# Patient Record
Sex: Female | Born: 1955 | Race: White | Hispanic: No | Marital: Married | Smoking: Never smoker
Health system: Southern US, Community
[De-identification: ages and names within clinical notes are randomized; demographics above are authoritative.]

## PROBLEM LIST (undated history)

## (undated) DIAGNOSIS — I10 Essential (primary) hypertension: Secondary | ICD-10-CM

---

## 1998-01-05 ENCOUNTER — Ambulatory Visit (HOSPITAL_COMMUNITY): Admission: RE | Admit: 1998-01-05 | Discharge: 1998-01-05 | Payer: Self-pay | Admitting: Family Medicine

## 1999-01-17 ENCOUNTER — Ambulatory Visit (HOSPITAL_COMMUNITY): Admission: RE | Admit: 1999-01-17 | Discharge: 1999-01-17 | Payer: Self-pay | Admitting: Family Medicine

## 2000-02-05 ENCOUNTER — Ambulatory Visit (HOSPITAL_COMMUNITY): Admission: RE | Admit: 2000-02-05 | Discharge: 2000-02-05 | Payer: Self-pay | Admitting: Family Medicine

## 2000-02-05 ENCOUNTER — Encounter: Payer: Self-pay | Admitting: Family Medicine

## 2001-02-11 ENCOUNTER — Ambulatory Visit (HOSPITAL_COMMUNITY): Admission: RE | Admit: 2001-02-11 | Discharge: 2001-02-11 | Payer: Self-pay | Admitting: Family Medicine

## 2001-02-11 ENCOUNTER — Encounter: Payer: Self-pay | Admitting: Family Medicine

## 2002-02-15 ENCOUNTER — Encounter: Payer: Self-pay | Admitting: Family Medicine

## 2002-02-15 ENCOUNTER — Ambulatory Visit (HOSPITAL_COMMUNITY): Admission: RE | Admit: 2002-02-15 | Discharge: 2002-02-15 | Payer: Self-pay | Admitting: Family Medicine

## 2003-01-13 ENCOUNTER — Other Ambulatory Visit: Admission: RE | Admit: 2003-01-13 | Discharge: 2003-01-13 | Payer: Self-pay | Admitting: Family Medicine

## 2003-02-24 ENCOUNTER — Encounter: Payer: Self-pay | Admitting: Family Medicine

## 2003-02-24 ENCOUNTER — Ambulatory Visit (HOSPITAL_COMMUNITY): Admission: RE | Admit: 2003-02-24 | Discharge: 2003-02-24 | Payer: Self-pay | Admitting: Family Medicine

## 2004-01-11 ENCOUNTER — Other Ambulatory Visit: Admission: RE | Admit: 2004-01-11 | Discharge: 2004-01-11 | Payer: Self-pay | Admitting: Family Medicine

## 2004-02-26 ENCOUNTER — Ambulatory Visit (HOSPITAL_COMMUNITY): Admission: RE | Admit: 2004-02-26 | Discharge: 2004-02-26 | Payer: Self-pay | Admitting: Family Medicine

## 2005-02-14 ENCOUNTER — Other Ambulatory Visit: Admission: RE | Admit: 2005-02-14 | Discharge: 2005-02-14 | Payer: Self-pay | Admitting: Family Medicine

## 2005-03-07 ENCOUNTER — Ambulatory Visit (HOSPITAL_COMMUNITY): Admission: RE | Admit: 2005-03-07 | Discharge: 2005-03-07 | Payer: Self-pay | Admitting: Family Medicine

## 2006-03-11 ENCOUNTER — Other Ambulatory Visit: Admission: RE | Admit: 2006-03-11 | Discharge: 2006-03-11 | Payer: Self-pay | Admitting: Family Medicine

## 2006-03-19 ENCOUNTER — Ambulatory Visit (HOSPITAL_COMMUNITY): Admission: RE | Admit: 2006-03-19 | Discharge: 2006-03-19 | Payer: Self-pay | Admitting: Family Medicine

## 2007-03-18 ENCOUNTER — Other Ambulatory Visit: Admission: RE | Admit: 2007-03-18 | Discharge: 2007-03-18 | Payer: Self-pay | Admitting: Family Medicine

## 2007-03-22 ENCOUNTER — Ambulatory Visit (HOSPITAL_COMMUNITY): Admission: RE | Admit: 2007-03-22 | Discharge: 2007-03-22 | Payer: Self-pay | Admitting: Family Medicine

## 2008-03-22 ENCOUNTER — Ambulatory Visit (HOSPITAL_COMMUNITY): Admission: RE | Admit: 2008-03-22 | Discharge: 2008-03-22 | Payer: Self-pay | Admitting: Family Medicine

## 2008-04-18 ENCOUNTER — Other Ambulatory Visit: Admission: RE | Admit: 2008-04-18 | Discharge: 2008-04-18 | Payer: Self-pay | Admitting: Family Medicine

## 2009-03-23 ENCOUNTER — Ambulatory Visit (HOSPITAL_COMMUNITY): Admission: RE | Admit: 2009-03-23 | Discharge: 2009-03-23 | Payer: Self-pay | Admitting: Family Medicine

## 2009-06-20 ENCOUNTER — Emergency Department (HOSPITAL_BASED_OUTPATIENT_CLINIC_OR_DEPARTMENT_OTHER): Admission: EM | Admit: 2009-06-20 | Discharge: 2009-06-20 | Payer: Self-pay | Admitting: Emergency Medicine

## 2010-04-24 ENCOUNTER — Ambulatory Visit (HOSPITAL_COMMUNITY): Admission: RE | Admit: 2010-04-24 | Discharge: 2010-04-24 | Payer: Self-pay | Admitting: Family Medicine

## 2011-04-28 ENCOUNTER — Other Ambulatory Visit (HOSPITAL_COMMUNITY): Payer: Self-pay | Admitting: Family Medicine

## 2011-04-28 DIAGNOSIS — Z1231 Encounter for screening mammogram for malignant neoplasm of breast: Secondary | ICD-10-CM

## 2011-05-01 ENCOUNTER — Ambulatory Visit (HOSPITAL_COMMUNITY)
Admission: RE | Admit: 2011-05-01 | Discharge: 2011-05-01 | Disposition: A | Discharge status: Home / Self Care | Payer: 59 | Source: Ambulatory Visit | Attending: Family Medicine | Admitting: Family Medicine

## 2011-05-01 DIAGNOSIS — Z1231 Encounter for screening mammogram for malignant neoplasm of breast: Secondary | ICD-10-CM | POA: Insufficient documentation

## 2011-05-06 ENCOUNTER — Other Ambulatory Visit: Payer: Self-pay | Admitting: Family Medicine

## 2011-05-06 DIAGNOSIS — R928 Other abnormal and inconclusive findings on diagnostic imaging of breast: Secondary | ICD-10-CM

## 2011-05-19 ENCOUNTER — Ambulatory Visit
Admission: RE | Admit: 2011-05-19 | Discharge: 2011-05-19 | Disposition: A | Discharge status: Home / Self Care | Payer: 59 | Source: Ambulatory Visit | Attending: Family Medicine | Admitting: Family Medicine

## 2011-05-19 ENCOUNTER — Other Ambulatory Visit: Payer: Self-pay | Admitting: Family Medicine

## 2011-05-19 DIAGNOSIS — R928 Other abnormal and inconclusive findings on diagnostic imaging of breast: Secondary | ICD-10-CM

## 2012-04-29 ENCOUNTER — Other Ambulatory Visit (HOSPITAL_COMMUNITY): Payer: Self-pay | Admitting: Family Medicine

## 2012-04-29 DIAGNOSIS — Z1231 Encounter for screening mammogram for malignant neoplasm of breast: Secondary | ICD-10-CM

## 2012-05-18 ENCOUNTER — Ambulatory Visit (HOSPITAL_COMMUNITY)
Admission: RE | Admit: 2012-05-18 | Discharge: 2012-05-18 | Disposition: A | Discharge status: Home / Self Care | Payer: 59 | Source: Ambulatory Visit | Attending: Family Medicine | Admitting: Family Medicine

## 2012-05-18 DIAGNOSIS — Z1231 Encounter for screening mammogram for malignant neoplasm of breast: Secondary | ICD-10-CM

## 2012-05-28 ENCOUNTER — Other Ambulatory Visit: Payer: Self-pay | Admitting: Family Medicine

## 2012-05-28 DIAGNOSIS — R928 Other abnormal and inconclusive findings on diagnostic imaging of breast: Secondary | ICD-10-CM

## 2012-06-09 ENCOUNTER — Ambulatory Visit
Admission: RE | Admit: 2012-06-09 | Discharge: 2012-06-09 | Disposition: A | Discharge status: Home / Self Care | Payer: 59 | Source: Ambulatory Visit | Attending: Family Medicine | Admitting: Family Medicine

## 2012-06-09 DIAGNOSIS — R928 Other abnormal and inconclusive findings on diagnostic imaging of breast: Secondary | ICD-10-CM

## 2013-06-03 ENCOUNTER — Other Ambulatory Visit (HOSPITAL_COMMUNITY): Payer: Self-pay | Admitting: Family Medicine

## 2013-06-03 DIAGNOSIS — Z1231 Encounter for screening mammogram for malignant neoplasm of breast: Secondary | ICD-10-CM

## 2013-06-22 ENCOUNTER — Ambulatory Visit (HOSPITAL_COMMUNITY)
Admission: RE | Admit: 2013-06-22 | Discharge: 2013-06-22 | Disposition: A | Discharge status: Home / Self Care | Payer: 59 | Source: Ambulatory Visit | Attending: Family Medicine | Admitting: Family Medicine

## 2013-06-22 DIAGNOSIS — Z1231 Encounter for screening mammogram for malignant neoplasm of breast: Secondary | ICD-10-CM

## 2014-06-15 ENCOUNTER — Other Ambulatory Visit (HOSPITAL_COMMUNITY): Payer: Self-pay | Admitting: Family Medicine

## 2014-06-15 DIAGNOSIS — Z1231 Encounter for screening mammogram for malignant neoplasm of breast: Secondary | ICD-10-CM

## 2014-06-28 ENCOUNTER — Ambulatory Visit (HOSPITAL_COMMUNITY)
Admission: RE | Admit: 2014-06-28 | Discharge: 2014-06-28 | Disposition: A | Discharge status: Home / Self Care | Payer: 59 | Source: Ambulatory Visit | Attending: Family Medicine | Admitting: Family Medicine

## 2014-06-28 DIAGNOSIS — Z1231 Encounter for screening mammogram for malignant neoplasm of breast: Secondary | ICD-10-CM | POA: Diagnosis not present

## 2015-06-18 ENCOUNTER — Other Ambulatory Visit: Payer: Self-pay

## 2015-06-18 DIAGNOSIS — Z1231 Encounter for screening mammogram for malignant neoplasm of breast: Secondary | ICD-10-CM

## 2015-07-25 ENCOUNTER — Ambulatory Visit
Admission: RE | Admit: 2015-07-25 | Discharge: 2015-07-25 | Disposition: A | Discharge status: Home / Self Care | Payer: 59 | Source: Ambulatory Visit

## 2015-07-25 DIAGNOSIS — Z1231 Encounter for screening mammogram for malignant neoplasm of breast: Secondary | ICD-10-CM

## 2015-08-14 MED FILL — ATENOLOL 50 MG TABLET: 50 | 90 days supply | Qty: 90 | Fill #0

## 2015-08-14 MED FILL — HYDROXYCHLOROQUINE 200 MG T: 200 | 30 days supply | Qty: 60 | Fill #2

## 2015-09-13 DIAGNOSIS — M2550 Pain in unspecified joint: Secondary | ICD-10-CM | POA: Diagnosis not present

## 2015-09-13 DIAGNOSIS — Z79899 Other long term (current) drug therapy: Secondary | ICD-10-CM | POA: Diagnosis not present

## 2015-09-13 DIAGNOSIS — Z8619 Personal history of other infectious and parasitic diseases: Secondary | ICD-10-CM | POA: Diagnosis not present

## 2015-09-13 DIAGNOSIS — M064 Inflammatory polyarthropathy: Secondary | ICD-10-CM | POA: Diagnosis not present

## 2015-09-13 DIAGNOSIS — L409 Psoriasis, unspecified: Secondary | ICD-10-CM | POA: Diagnosis not present

## 2015-09-13 DIAGNOSIS — M2540 Effusion, unspecified joint: Secondary | ICD-10-CM | POA: Diagnosis not present

## 2015-09-13 MED FILL — HYDROXYCHLOROQUINE 200 MG T: 200 | 30 days supply | Qty: 60 | Fill #2

## 2015-09-13 MED FILL — METHOTREXATE 2.5 MG TABLET: 2.5 | 28 days supply | Qty: 16 | Fill #0

## 2015-09-13 MED FILL — FOLIC ACID 1 MG TABLET: 1 | 90 days supply | Qty: 90 | Fill #0

## 2015-10-09 MED FILL — METHOTREXATE 2.5 MG TABLET: 2.5 | 28 days supply | Qty: 16 | Fill #1

## 2015-10-09 MED FILL — HYDROXYCHLOROQUINE 200 MG T: 200 | 30 days supply | Qty: 60 | Fill #0

## 2015-10-26 DIAGNOSIS — Z79899 Other long term (current) drug therapy: Secondary | ICD-10-CM | POA: Diagnosis not present

## 2015-11-08 MED FILL — HYDROXYCHLOROQUINE 200 MG T: 200 | 30 days supply | Qty: 60 | Fill #1

## 2015-11-08 MED FILL — METHOTREXATE 2.5 MG TABLET: 2.5 | 28 days supply | Qty: 16 | Fill #2

## 2015-11-12 DIAGNOSIS — M064 Inflammatory polyarthropathy: Secondary | ICD-10-CM | POA: Diagnosis not present

## 2015-11-12 DIAGNOSIS — M2540 Effusion, unspecified joint: Secondary | ICD-10-CM | POA: Diagnosis not present

## 2015-11-12 DIAGNOSIS — M2550 Pain in unspecified joint: Secondary | ICD-10-CM | POA: Diagnosis not present

## 2015-11-12 DIAGNOSIS — Z79899 Other long term (current) drug therapy: Secondary | ICD-10-CM | POA: Diagnosis not present

## 2015-11-12 DIAGNOSIS — L409 Psoriasis, unspecified: Secondary | ICD-10-CM | POA: Diagnosis not present

## 2015-11-12 DIAGNOSIS — Z8619 Personal history of other infectious and parasitic diseases: Secondary | ICD-10-CM | POA: Diagnosis not present

## 2015-11-28 MED FILL — HYDROXYCHLOROQUINE 200 MG T: 200 | 30 days supply | Qty: 60 | Fill #0

## 2015-11-28 MED FILL — FOLIC ACID 1 MG TABLET: 1 | 30 days supply | Qty: 30 | Fill #0

## 2015-11-28 MED FILL — METHOTREXATE 2.5 MG TABLET: 2.5 | 28 days supply | Qty: 24 | Fill #0

## 2016-01-01 MED FILL — METHOTREXATE 2.5 MG TABLET: 2.5 | 28 days supply | Qty: 24 | Fill #1

## 2016-01-01 MED FILL — HYDROXYCHLOROQUINE 200 MG T: 200 | 30 days supply | Qty: 60 | Fill #1

## 2016-01-01 MED FILL — FOLIC ACID 1 MG TABLET: 1 | 30 days supply | Qty: 30 | Fill #1

## 2016-01-04 DIAGNOSIS — M064 Inflammatory polyarthropathy: Secondary | ICD-10-CM | POA: Diagnosis not present

## 2016-01-15 DIAGNOSIS — Z8619 Personal history of other infectious and parasitic diseases: Secondary | ICD-10-CM | POA: Diagnosis not present

## 2016-01-15 DIAGNOSIS — M064 Inflammatory polyarthropathy: Secondary | ICD-10-CM | POA: Diagnosis not present

## 2016-01-15 DIAGNOSIS — Z79899 Other long term (current) drug therapy: Secondary | ICD-10-CM | POA: Diagnosis not present

## 2016-01-15 DIAGNOSIS — L409 Psoriasis, unspecified: Secondary | ICD-10-CM | POA: Diagnosis not present

## 2016-01-15 DIAGNOSIS — M2540 Effusion, unspecified joint: Secondary | ICD-10-CM | POA: Diagnosis not present

## 2016-01-15 DIAGNOSIS — M2550 Pain in unspecified joint: Secondary | ICD-10-CM | POA: Diagnosis not present

## 2016-01-23 MED FILL — METHOTREXATE 2.5 MG TABLET: 2.5 | 28 days supply | Qty: 24 | Fill #2

## 2016-02-15 MED FILL — FOLIC ACID 1 MG TABLET: 1 | 30 days supply | Qty: 30 | Fill #2

## 2016-02-15 MED FILL — ATENOLOL 50 MG TABLET: 50 | 90 days supply | Qty: 90 | Fill #1

## 2016-02-15 MED FILL — HYDROXYCHLOROQUINE 200 MG T: 200 | 30 days supply | Qty: 60 | Fill #2

## 2016-02-21 MED FILL — METHOTREXATE 2.5 MG TABLET: 2.5 | 28 days supply | Qty: 24 | Fill #0

## 2016-03-12 DIAGNOSIS — Z79899 Other long term (current) drug therapy: Secondary | ICD-10-CM | POA: Diagnosis not present

## 2016-03-12 DIAGNOSIS — M064 Inflammatory polyarthropathy: Secondary | ICD-10-CM | POA: Diagnosis not present

## 2016-03-18 DIAGNOSIS — Z8619 Personal history of other infectious and parasitic diseases: Secondary | ICD-10-CM | POA: Diagnosis not present

## 2016-03-18 DIAGNOSIS — M2540 Effusion, unspecified joint: Secondary | ICD-10-CM | POA: Diagnosis not present

## 2016-03-18 DIAGNOSIS — M79642 Pain in left hand: Secondary | ICD-10-CM | POA: Diagnosis not present

## 2016-03-18 DIAGNOSIS — M059 Rheumatoid arthritis with rheumatoid factor, unspecified: Secondary | ICD-10-CM | POA: Diagnosis not present

## 2016-03-18 DIAGNOSIS — M79641 Pain in right hand: Secondary | ICD-10-CM | POA: Diagnosis not present

## 2016-03-18 DIAGNOSIS — M2550 Pain in unspecified joint: Secondary | ICD-10-CM | POA: Diagnosis not present

## 2016-03-18 DIAGNOSIS — L409 Psoriasis, unspecified: Secondary | ICD-10-CM | POA: Diagnosis not present

## 2016-03-18 DIAGNOSIS — Z79899 Other long term (current) drug therapy: Secondary | ICD-10-CM | POA: Diagnosis not present

## 2016-03-18 MED FILL — FOLIC ACID 1 MG TABLET: 1 | 30 days supply | Qty: 30 | Fill #0

## 2016-03-18 MED FILL — METHOTREXATE 2.5 MG TABLET: 2.5 | 30 days supply | Qty: 24 | Fill #0

## 2016-04-17 MED FILL — METHOTREXATE 2.5 MG TABLET: 2.5 | 30 days supply | Qty: 24 | Fill #1

## 2016-04-17 MED FILL — FOLIC ACID 1 MG TABLET: 1 | 30 days supply | Qty: 30 | Fill #1

## 2016-04-24 DIAGNOSIS — H04123 Dry eye syndrome of bilateral lacrimal glands: Secondary | ICD-10-CM | POA: Diagnosis not present

## 2016-04-24 DIAGNOSIS — H2513 Age-related nuclear cataract, bilateral: Secondary | ICD-10-CM | POA: Diagnosis not present

## 2016-04-24 DIAGNOSIS — H40013 Open angle with borderline findings, low risk, bilateral: Secondary | ICD-10-CM | POA: Diagnosis not present

## 2016-05-12 MED FILL — FOLIC ACID 1 MG TABLET: 1 | 30 days supply | Qty: 30 | Fill #2

## 2016-05-12 MED FILL — METHOTREXATE 2.5 MG TABLET: 2.5 | 30 days supply | Qty: 24 | Fill #2

## 2016-05-26 DIAGNOSIS — M059 Rheumatoid arthritis with rheumatoid factor, unspecified: Secondary | ICD-10-CM | POA: Diagnosis not present

## 2016-05-26 DIAGNOSIS — Z79899 Other long term (current) drug therapy: Secondary | ICD-10-CM | POA: Diagnosis not present

## 2016-05-26 DIAGNOSIS — L409 Psoriasis, unspecified: Secondary | ICD-10-CM | POA: Diagnosis not present

## 2016-05-26 DIAGNOSIS — M2550 Pain in unspecified joint: Secondary | ICD-10-CM | POA: Diagnosis not present

## 2016-06-02 MED FILL — FOLIC ACID 1 MG TABLET: 1 | 90 days supply | Qty: 90 | Fill #0

## 2016-06-02 MED FILL — METHOTREXATE 2.5 MG TABLET: 2.5 | 84 days supply | Qty: 84 | Fill #0

## 2016-06-23 DIAGNOSIS — M059 Rheumatoid arthritis with rheumatoid factor, unspecified: Secondary | ICD-10-CM | POA: Diagnosis not present

## 2016-07-02 DIAGNOSIS — E7800 Pure hypercholesterolemia, unspecified: Secondary | ICD-10-CM | POA: Diagnosis not present

## 2016-07-02 DIAGNOSIS — Z0000 Encounter for general adult medical examination without abnormal findings: Secondary | ICD-10-CM | POA: Diagnosis not present

## 2016-07-02 DIAGNOSIS — I10 Essential (primary) hypertension: Secondary | ICD-10-CM | POA: Diagnosis not present

## 2016-07-14 ENCOUNTER — Other Ambulatory Visit: Payer: Self-pay | Admitting: Family Medicine

## 2016-07-14 DIAGNOSIS — Z1231 Encounter for screening mammogram for malignant neoplasm of breast: Secondary | ICD-10-CM

## 2016-08-07 DIAGNOSIS — H40013 Open angle with borderline findings, low risk, bilateral: Secondary | ICD-10-CM | POA: Diagnosis not present

## 2016-08-11 ENCOUNTER — Ambulatory Visit: Payer: 59

## 2016-08-25 DIAGNOSIS — Z6825 Body mass index (BMI) 25.0-25.9, adult: Secondary | ICD-10-CM | POA: Diagnosis not present

## 2016-08-25 DIAGNOSIS — L409 Psoriasis, unspecified: Secondary | ICD-10-CM | POA: Diagnosis not present

## 2016-08-25 DIAGNOSIS — E663 Overweight: Secondary | ICD-10-CM | POA: Diagnosis not present

## 2016-08-25 DIAGNOSIS — M2550 Pain in unspecified joint: Secondary | ICD-10-CM | POA: Diagnosis not present

## 2016-08-25 DIAGNOSIS — Z79899 Other long term (current) drug therapy: Secondary | ICD-10-CM | POA: Diagnosis not present

## 2016-08-25 DIAGNOSIS — M059 Rheumatoid arthritis with rheumatoid factor, unspecified: Secondary | ICD-10-CM | POA: Diagnosis not present

## 2016-09-01 ENCOUNTER — Ambulatory Visit
Admission: RE | Admit: 2016-09-01 | Discharge: 2016-09-01 | Disposition: A | Discharge status: Home / Self Care | Payer: 59 | Source: Ambulatory Visit | Attending: Family Medicine | Admitting: Family Medicine

## 2016-09-01 DIAGNOSIS — Z1231 Encounter for screening mammogram for malignant neoplasm of breast: Secondary | ICD-10-CM | POA: Diagnosis not present

## 2016-09-01 MED FILL — METHOTREXATE 2.5 MG TABLET: 2.5 | 28 days supply | Qty: 28 | Fill #1

## 2016-09-01 MED FILL — FOLIC ACID 1 MG TABLET: 1 | 30 days supply | Qty: 30 | Fill #1

## 2016-09-10 MED FILL — GAVILYTE-N SOLUTION: 420 | 2 days supply | Qty: 4000 | Fill #0

## 2016-09-15 DIAGNOSIS — Z1211 Encounter for screening for malignant neoplasm of colon: Secondary | ICD-10-CM | POA: Diagnosis not present

## 2016-09-25 MED FILL — FOLIC ACID 1 MG TABLET: 1 | 90 days supply | Qty: 90 | Fill #0

## 2016-09-25 MED FILL — METHOTREXATE 2.5 MG TABLET: 2.5 | 84 days supply | Qty: 84 | Fill #0

## 2016-11-20 DIAGNOSIS — M059 Rheumatoid arthritis with rheumatoid factor, unspecified: Secondary | ICD-10-CM | POA: Diagnosis not present

## 2016-12-24 MED FILL — FOLIC ACID 1 MG TABLET: 1 | 90 days supply | Qty: 90 | Fill #1

## 2016-12-24 MED FILL — METHOTREXATE 2.5 MG TABLET: 2.5 | 84 days supply | Qty: 84 | Fill #1

## 2017-01-15 MED FILL — ATENOLOL 50 MG TABLET: 50 | 90 days supply | Qty: 45 | Fill #0

## 2017-02-03 DIAGNOSIS — H40013 Open angle with borderline findings, low risk, bilateral: Secondary | ICD-10-CM | POA: Diagnosis not present

## 2017-02-19 DIAGNOSIS — E663 Overweight: Secondary | ICD-10-CM | POA: Diagnosis not present

## 2017-02-19 DIAGNOSIS — Z6825 Body mass index (BMI) 25.0-25.9, adult: Secondary | ICD-10-CM | POA: Diagnosis not present

## 2017-02-19 DIAGNOSIS — L409 Psoriasis, unspecified: Secondary | ICD-10-CM | POA: Diagnosis not present

## 2017-02-19 DIAGNOSIS — M2550 Pain in unspecified joint: Secondary | ICD-10-CM | POA: Diagnosis not present

## 2017-02-19 DIAGNOSIS — Z79899 Other long term (current) drug therapy: Secondary | ICD-10-CM | POA: Diagnosis not present

## 2017-02-19 DIAGNOSIS — M059 Rheumatoid arthritis with rheumatoid factor, unspecified: Secondary | ICD-10-CM | POA: Diagnosis not present

## 2017-03-17 MED FILL — FOLIC ACID 1 MG TABLET: 1 | 90 days supply | Qty: 90 | Fill #0

## 2017-03-17 MED FILL — METHOTREXATE 2.5 MG TABLET: 2.5 | 84 days supply | Qty: 84 | Fill #0

## 2017-03-24 DIAGNOSIS — M059 Rheumatoid arthritis with rheumatoid factor, unspecified: Secondary | ICD-10-CM | POA: Diagnosis not present

## 2017-04-28 MED FILL — ATENOLOL 50 MG TABLET: 50 | 90 days supply | Qty: 45 | Fill #1

## 2017-05-22 DIAGNOSIS — M059 Rheumatoid arthritis with rheumatoid factor, unspecified: Secondary | ICD-10-CM | POA: Diagnosis not present

## 2017-05-22 DIAGNOSIS — H40013 Open angle with borderline findings, low risk, bilateral: Secondary | ICD-10-CM | POA: Diagnosis not present

## 2017-05-22 DIAGNOSIS — Z23 Encounter for immunization: Secondary | ICD-10-CM | POA: Diagnosis not present

## 2017-06-09 MED FILL — METHOTREXATE 2.5 MG TABLET: 2.5 | 84 days supply | Qty: 84 | Fill #1

## 2017-07-14 DIAGNOSIS — Z124 Encounter for screening for malignant neoplasm of cervix: Secondary | ICD-10-CM | POA: Diagnosis not present

## 2017-07-14 DIAGNOSIS — E7800 Pure hypercholesterolemia, unspecified: Secondary | ICD-10-CM | POA: Diagnosis not present

## 2017-07-14 DIAGNOSIS — Z0000 Encounter for general adult medical examination without abnormal findings: Secondary | ICD-10-CM | POA: Diagnosis not present

## 2017-07-14 DIAGNOSIS — I10 Essential (primary) hypertension: Secondary | ICD-10-CM | POA: Diagnosis not present

## 2017-07-14 DIAGNOSIS — R87611 Atypical squamous cells cannot exclude high grade squamous intraepithelial lesion on cytologic smear of cervix (ASC-H): Secondary | ICD-10-CM | POA: Diagnosis not present

## 2017-07-15 MED FILL — FOLIC ACID 1 MG TABLET: 1 | 90 days supply | Qty: 90 | Fill #1

## 2017-08-03 ENCOUNTER — Other Ambulatory Visit: Payer: Self-pay | Admitting: Family Medicine

## 2017-08-03 DIAGNOSIS — Z1231 Encounter for screening mammogram for malignant neoplasm of breast: Secondary | ICD-10-CM

## 2017-08-05 MED FILL — ATENOLOL 50 MG TABLET: 50 | 90 days supply | Qty: 45 | Fill #0

## 2017-08-07 DIAGNOSIS — H40013 Open angle with borderline findings, low risk, bilateral: Secondary | ICD-10-CM | POA: Diagnosis not present

## 2017-08-24 DIAGNOSIS — M2550 Pain in unspecified joint: Secondary | ICD-10-CM | POA: Diagnosis not present

## 2017-08-24 DIAGNOSIS — L409 Psoriasis, unspecified: Secondary | ICD-10-CM | POA: Diagnosis not present

## 2017-08-24 DIAGNOSIS — M059 Rheumatoid arthritis with rheumatoid factor, unspecified: Secondary | ICD-10-CM | POA: Diagnosis not present

## 2017-08-24 DIAGNOSIS — Z79899 Other long term (current) drug therapy: Secondary | ICD-10-CM | POA: Diagnosis not present

## 2017-08-25 DIAGNOSIS — R87611 Atypical squamous cells cannot exclude high grade squamous intraepithelial lesion on cytologic smear of cervix (ASC-H): Secondary | ICD-10-CM | POA: Diagnosis not present

## 2017-08-25 DIAGNOSIS — N888 Other specified noninflammatory disorders of cervix uteri: Secondary | ICD-10-CM | POA: Diagnosis not present

## 2017-08-25 DIAGNOSIS — N870 Mild cervical dysplasia: Secondary | ICD-10-CM | POA: Diagnosis not present

## 2017-08-25 DIAGNOSIS — N882 Stricture and stenosis of cervix uteri: Secondary | ICD-10-CM | POA: Diagnosis not present

## 2017-09-02 ENCOUNTER — Ambulatory Visit
Admission: RE | Admit: 2017-09-02 | Discharge: 2017-09-02 | Disposition: A | Discharge status: Home / Self Care | Payer: BLUE CROSS/BLUE SHIELD | Source: Ambulatory Visit | Attending: Family Medicine | Admitting: Family Medicine

## 2017-09-02 DIAGNOSIS — Z1231 Encounter for screening mammogram for malignant neoplasm of breast: Secondary | ICD-10-CM | POA: Diagnosis not present

## 2017-10-30 MED FILL — ATENOLOL 50 MG TABLET: 50 | 90 days supply | Qty: 45 | Fill #1

## 2017-11-16 DIAGNOSIS — M059 Rheumatoid arthritis with rheumatoid factor, unspecified: Secondary | ICD-10-CM | POA: Diagnosis not present

## 2017-11-16 DIAGNOSIS — Z79899 Other long term (current) drug therapy: Secondary | ICD-10-CM | POA: Diagnosis not present

## 2017-11-16 DIAGNOSIS — M2550 Pain in unspecified joint: Secondary | ICD-10-CM | POA: Diagnosis not present

## 2017-11-16 DIAGNOSIS — L409 Psoriasis, unspecified: Secondary | ICD-10-CM | POA: Diagnosis not present

## 2017-11-17 DIAGNOSIS — R1310 Dysphagia, unspecified: Secondary | ICD-10-CM | POA: Diagnosis not present

## 2017-11-17 MED FILL — predniSONE 10 MG (48) TBPK: 10 | 12 days supply | Qty: 48 | Fill #0

## 2017-12-22 DIAGNOSIS — S41112A Laceration without foreign body of left upper arm, initial encounter: Secondary | ICD-10-CM | POA: Diagnosis not present

## 2018-01-05 ENCOUNTER — Other Ambulatory Visit (HOSPITAL_COMMUNITY)
Admission: RE | Admit: 2018-01-05 | Discharge: 2018-01-05 | Disposition: A | Discharge status: Home / Self Care | Payer: BLUE CROSS/BLUE SHIELD | Source: Ambulatory Visit | Attending: Obstetrics and Gynecology | Admitting: Obstetrics and Gynecology

## 2018-01-05 ENCOUNTER — Other Ambulatory Visit: Payer: Self-pay | Admitting: Obstetrics and Gynecology

## 2018-01-05 DIAGNOSIS — Z01419 Encounter for gynecological examination (general) (routine) without abnormal findings: Secondary | ICD-10-CM | POA: Diagnosis not present

## 2018-01-05 DIAGNOSIS — Z01411 Encounter for gynecological examination (general) (routine) with abnormal findings: Secondary | ICD-10-CM | POA: Diagnosis not present

## 2018-01-05 DIAGNOSIS — N870 Mild cervical dysplasia: Secondary | ICD-10-CM | POA: Diagnosis not present

## 2018-01-07 LAB — CYTOLOGY - PAP
Diagnosis: NEGATIVE
HPV (WINDOPATH): DETECTED — AB
HPV 16/18/45 GENOTYPING: NEGATIVE

## 2018-02-01 MED FILL — ATENOLOL 50 MG TABLET: 50 | 90 days supply | Qty: 45 | Fill #2

## 2018-02-04 DIAGNOSIS — H40013 Open angle with borderline findings, low risk, bilateral: Secondary | ICD-10-CM | POA: Diagnosis not present

## 2018-02-11 ENCOUNTER — Other Ambulatory Visit: Payer: Self-pay | Admitting: Obstetrics and Gynecology

## 2018-02-11 DIAGNOSIS — N871 Moderate cervical dysplasia: Secondary | ICD-10-CM | POA: Diagnosis not present

## 2018-02-11 MED FILL — DOXYCYCLINE HYCLATE 100 MG: 100 | 7 days supply | Qty: 14 | Fill #0

## 2018-02-15 DIAGNOSIS — M059 Rheumatoid arthritis with rheumatoid factor, unspecified: Secondary | ICD-10-CM | POA: Diagnosis not present

## 2018-03-09 DIAGNOSIS — Z9889 Other specified postprocedural states: Secondary | ICD-10-CM | POA: Diagnosis not present

## 2018-04-28 MED FILL — ATENOLOL 50 MG TABLET: 50 | 90 days supply | Qty: 45 | Fill #3

## 2018-05-17 DIAGNOSIS — M2550 Pain in unspecified joint: Secondary | ICD-10-CM | POA: Diagnosis not present

## 2018-05-17 DIAGNOSIS — Z79899 Other long term (current) drug therapy: Secondary | ICD-10-CM | POA: Diagnosis not present

## 2018-05-17 DIAGNOSIS — M059 Rheumatoid arthritis with rheumatoid factor, unspecified: Secondary | ICD-10-CM | POA: Diagnosis not present

## 2018-05-17 DIAGNOSIS — Z23 Encounter for immunization: Secondary | ICD-10-CM | POA: Diagnosis not present

## 2018-05-17 DIAGNOSIS — L409 Psoriasis, unspecified: Secondary | ICD-10-CM | POA: Diagnosis not present

## 2018-07-15 DIAGNOSIS — I10 Essential (primary) hypertension: Secondary | ICD-10-CM | POA: Diagnosis not present

## 2018-07-15 DIAGNOSIS — Z0000 Encounter for general adult medical examination without abnormal findings: Secondary | ICD-10-CM | POA: Diagnosis not present

## 2018-07-15 DIAGNOSIS — E7800 Pure hypercholesterolemia, unspecified: Secondary | ICD-10-CM | POA: Diagnosis not present

## 2018-07-15 MED FILL — BETAMETHASONE DP 0.05% CRM: 0.05 | 30 days supply | Qty: 45 | Fill #0

## 2018-07-16 MED FILL — ATENOLOL 50 MG TABLET: 50 | 90 days supply | Qty: 45 | Fill #0

## 2018-08-05 ENCOUNTER — Other Ambulatory Visit: Payer: Self-pay | Admitting: Family Medicine

## 2018-08-05 DIAGNOSIS — Z1231 Encounter for screening mammogram for malignant neoplasm of breast: Secondary | ICD-10-CM

## 2018-08-12 DIAGNOSIS — H40013 Open angle with borderline findings, low risk, bilateral: Secondary | ICD-10-CM | POA: Diagnosis not present

## 2018-08-17 DIAGNOSIS — M059 Rheumatoid arthritis with rheumatoid factor, unspecified: Secondary | ICD-10-CM | POA: Diagnosis not present

## 2018-08-28 IMAGING — MG 2D DIGITAL SCREENING BILATERAL MAMMOGRAM WITH 3D TOMO WITH CAD
9 of 12 series · 9 of 28 positions shown · non-contrast
Comparison: Previous exam(s).

CLINICAL DATA: Screening.

EXAM:
2D DIGITAL SCREENING BILATERAL MAMMOGRAM WITH 3D TOMO WITH CAD

[R CC synth-2D]
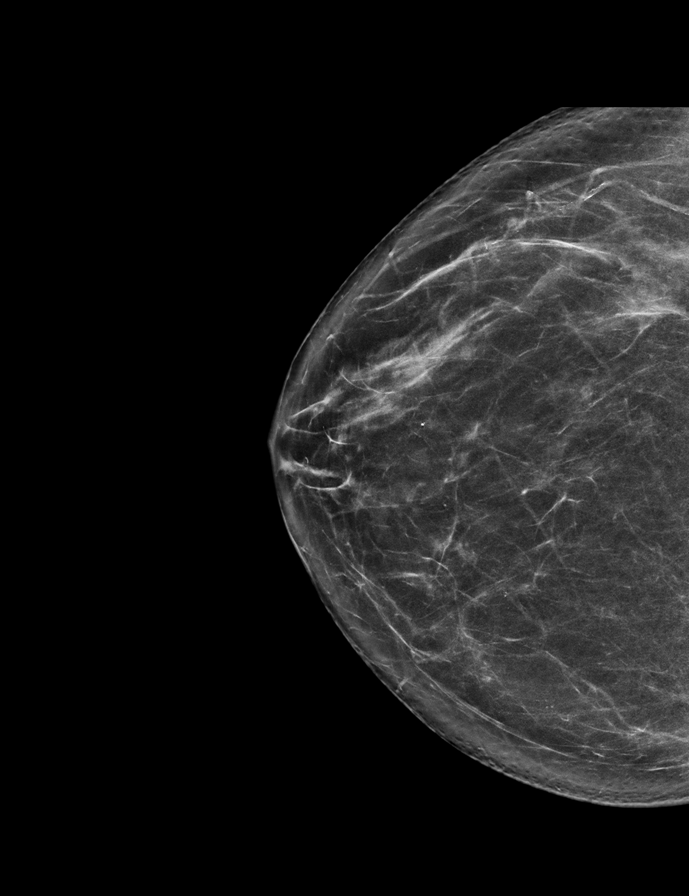

[L MLO synth-2D]
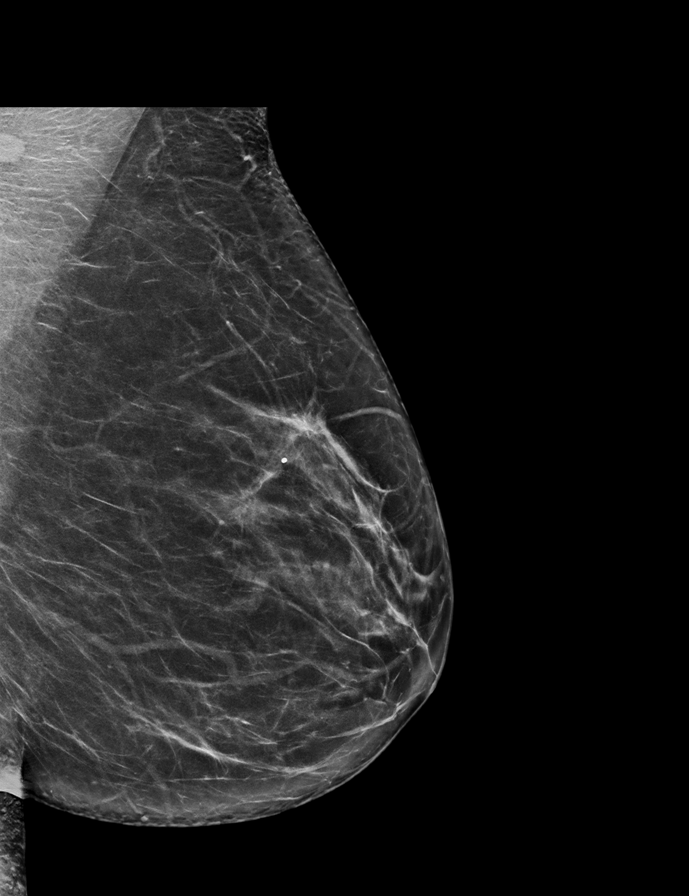

[R MLO synth-2D]
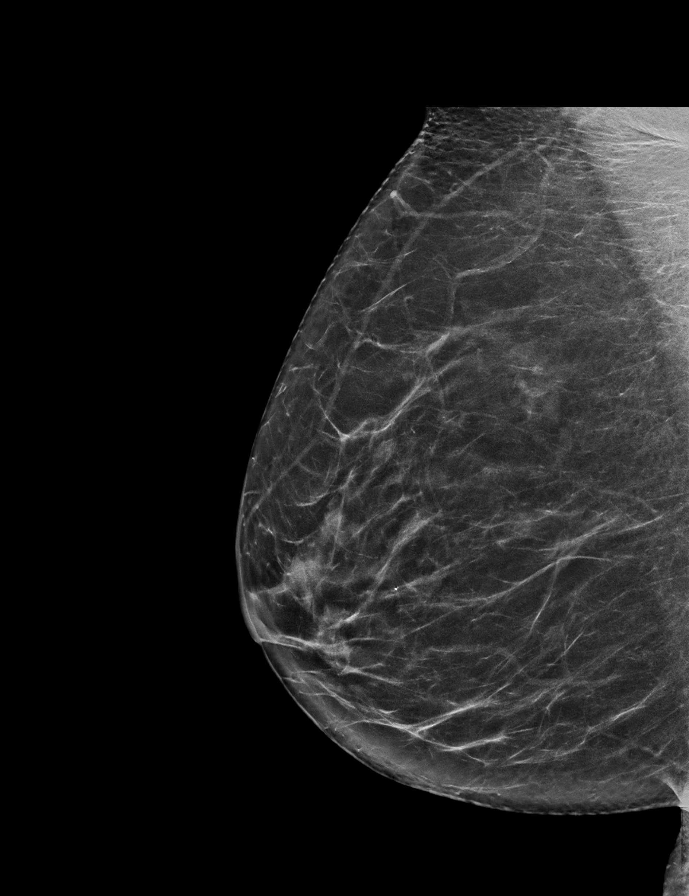

[R CC]
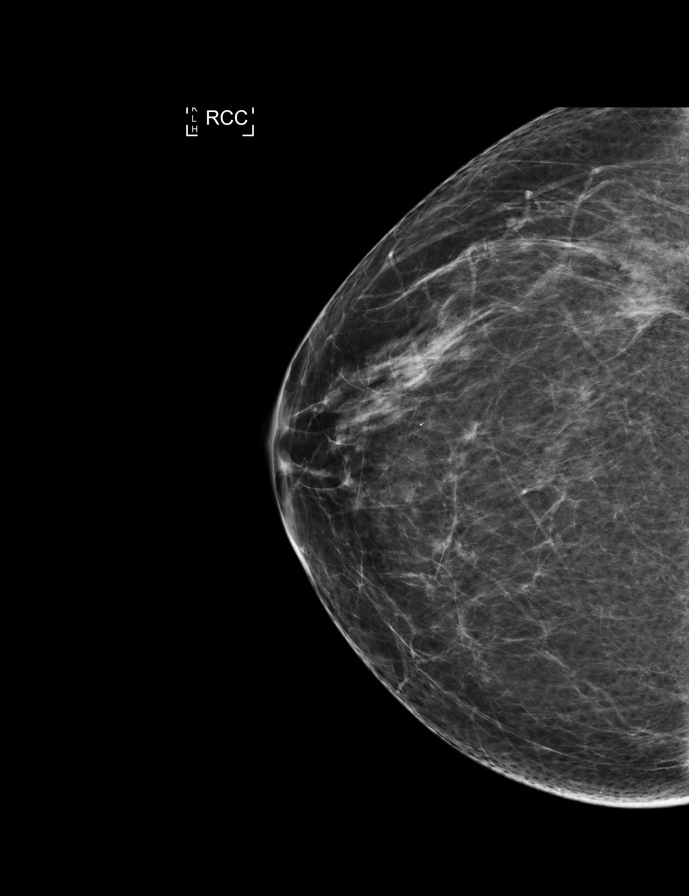

[L CC]
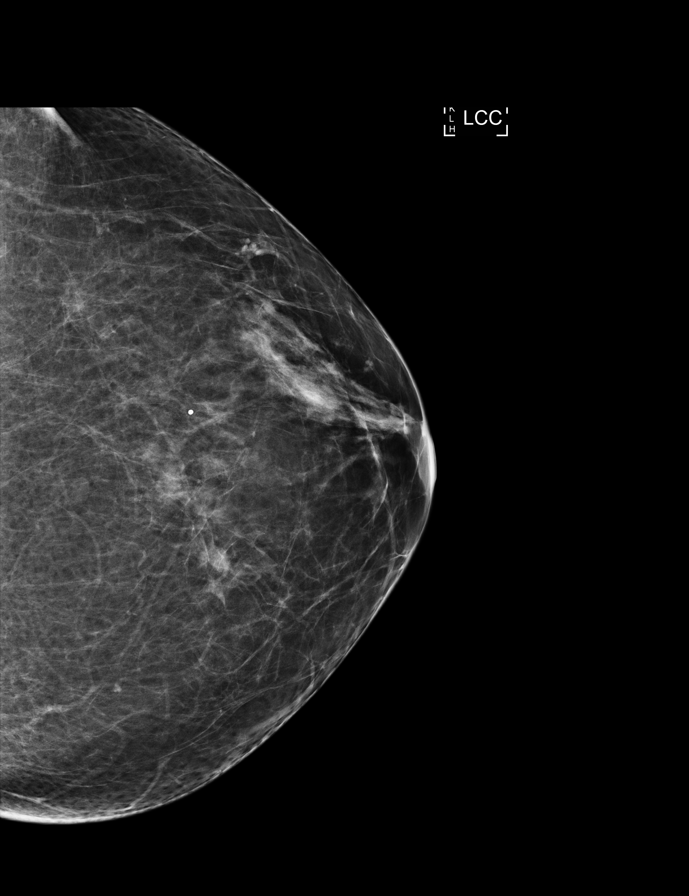

[L MLO]
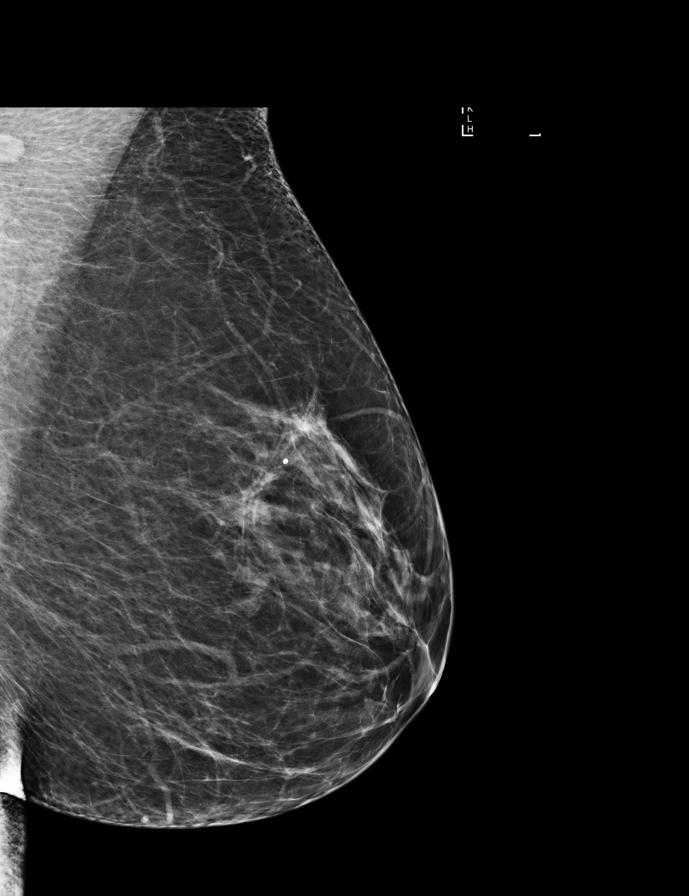

[L CC synth-2D]
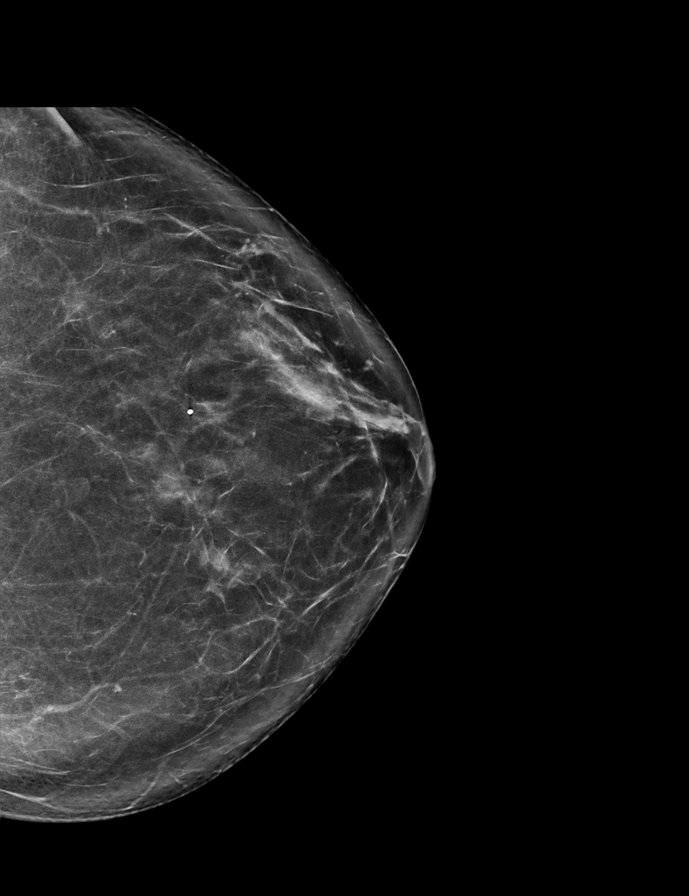

[R MLO]
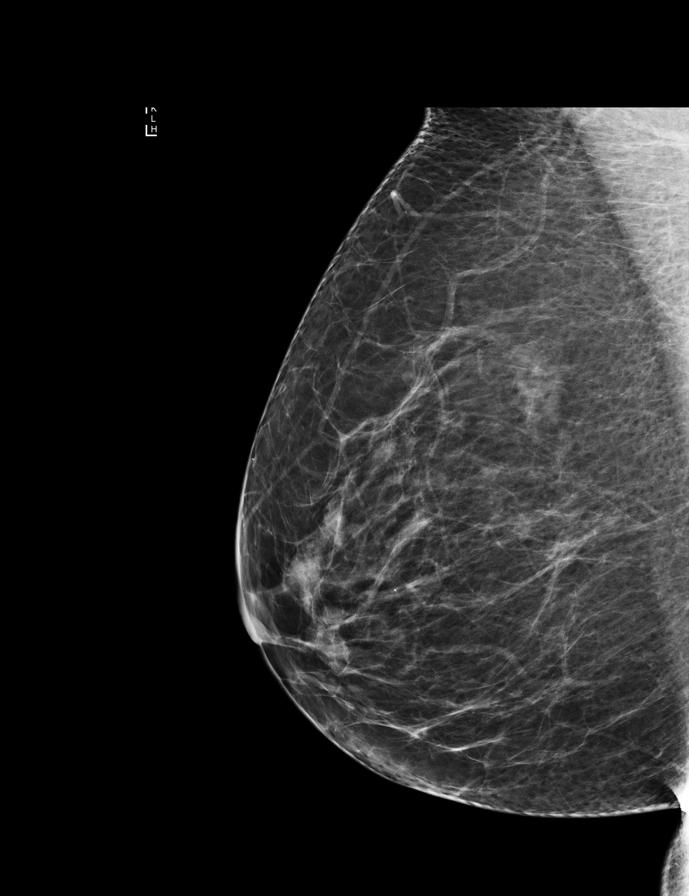

[L CC tomo · tomo slice 37/74.0]
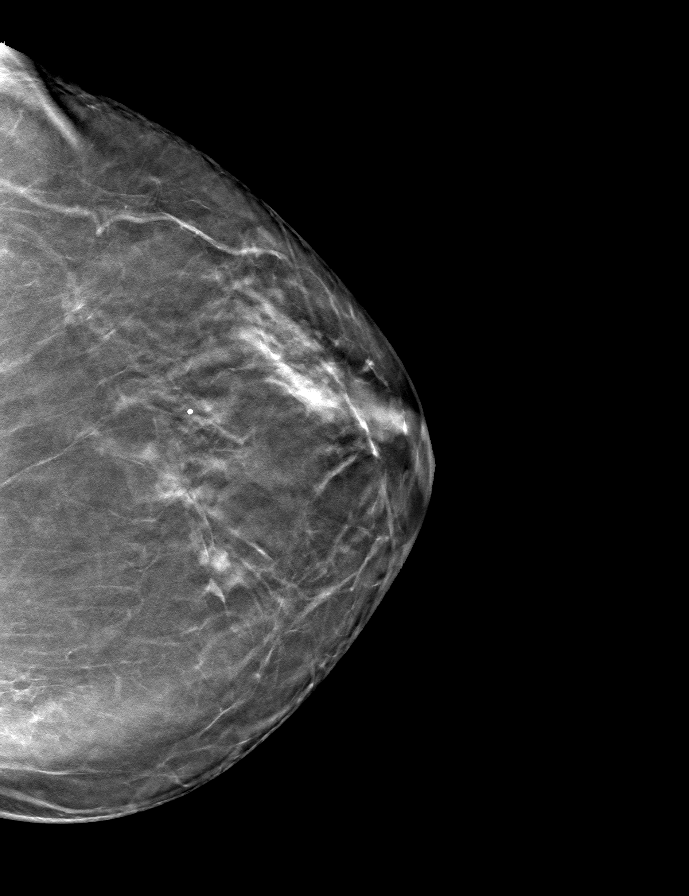

[9 of 28 positions shown; findings below may reference images not displayed]

ACR Breast Density Category b: There are scattered areas of
fibroglandular density.
FINDINGS: There are no findings suspicious for malignancy. Images were
processed with CAD.
IMPRESSION: No mammographic evidence of malignancy. A result letter of this
screening mammogram will be mailed directly to the patient.

RECOMMENDATION:
Screening mammogram in one year. (Code:[id number])

BI-RADS CATEGORY  1: Negative.

## 2018-09-13 ENCOUNTER — Ambulatory Visit
Admission: RE | Admit: 2018-09-13 | Discharge: 2018-09-13 | Disposition: A | Discharge status: Home / Self Care | Payer: BLUE CROSS/BLUE SHIELD | Source: Ambulatory Visit | Attending: Family Medicine | Admitting: Family Medicine

## 2018-09-13 DIAGNOSIS — Z1231 Encounter for screening mammogram for malignant neoplasm of breast: Secondary | ICD-10-CM | POA: Diagnosis not present

## 2018-10-11 MED FILL — ATENOLOL 50 MG TABLET: 50 | 90 days supply | Qty: 45 | Fill #1

## 2018-11-16 DIAGNOSIS — L409 Psoriasis, unspecified: Secondary | ICD-10-CM | POA: Diagnosis not present

## 2018-11-16 DIAGNOSIS — M2550 Pain in unspecified joint: Secondary | ICD-10-CM | POA: Diagnosis not present

## 2018-11-16 DIAGNOSIS — M059 Rheumatoid arthritis with rheumatoid factor, unspecified: Secondary | ICD-10-CM | POA: Diagnosis not present

## 2018-11-16 DIAGNOSIS — Z79899 Other long term (current) drug therapy: Secondary | ICD-10-CM | POA: Diagnosis not present

## 2019-01-13 MED FILL — ATENOLOL 50 MG TABLET: 50 | 90 days supply | Qty: 45 | Fill #2

## 2019-02-15 DIAGNOSIS — I10 Essential (primary) hypertension: Secondary | ICD-10-CM | POA: Diagnosis not present

## 2019-02-15 DIAGNOSIS — E7800 Pure hypercholesterolemia, unspecified: Secondary | ICD-10-CM | POA: Diagnosis not present

## 2019-02-15 DIAGNOSIS — G2581 Restless legs syndrome: Secondary | ICD-10-CM | POA: Diagnosis not present

## 2019-02-15 DIAGNOSIS — M069 Rheumatoid arthritis, unspecified: Secondary | ICD-10-CM | POA: Diagnosis not present

## 2019-02-16 DIAGNOSIS — Z79899 Other long term (current) drug therapy: Secondary | ICD-10-CM | POA: Diagnosis not present

## 2019-02-16 DIAGNOSIS — M059 Rheumatoid arthritis with rheumatoid factor, unspecified: Secondary | ICD-10-CM | POA: Diagnosis not present

## 2019-02-17 DIAGNOSIS — H40013 Open angle with borderline findings, low risk, bilateral: Secondary | ICD-10-CM | POA: Diagnosis not present

## 2019-03-14 ENCOUNTER — Other Ambulatory Visit: Payer: Self-pay | Admitting: Obstetrics and Gynecology

## 2019-03-14 ENCOUNTER — Other Ambulatory Visit (HOSPITAL_COMMUNITY)
Admission: RE | Admit: 2019-03-14 | Discharge: 2019-03-14 | Disposition: A | Discharge status: Home / Self Care | Payer: BC Managed Care – PPO | Source: Ambulatory Visit | Attending: Obstetrics and Gynecology | Admitting: Obstetrics and Gynecology

## 2019-03-14 DIAGNOSIS — B977 Papillomavirus as the cause of diseases classified elsewhere: Secondary | ICD-10-CM | POA: Diagnosis not present

## 2019-03-14 DIAGNOSIS — E2839 Other primary ovarian failure: Secondary | ICD-10-CM

## 2019-03-14 DIAGNOSIS — Z124 Encounter for screening for malignant neoplasm of cervix: Secondary | ICD-10-CM | POA: Insufficient documentation

## 2019-03-14 DIAGNOSIS — N871 Moderate cervical dysplasia: Secondary | ICD-10-CM | POA: Diagnosis not present

## 2019-03-14 DIAGNOSIS — Z01411 Encounter for gynecological examination (general) (routine) with abnormal findings: Secondary | ICD-10-CM | POA: Diagnosis not present

## 2019-03-16 LAB — CYTOLOGY - PAP
Diagnosis: NEGATIVE
HPV: NOT DETECTED

## 2019-03-22 MED FILL — ATENOLOL 50 MG TABLET: 50 | 90 days supply | Qty: 45 | Fill #3

## 2019-05-25 ENCOUNTER — Other Ambulatory Visit: Payer: BLUE CROSS/BLUE SHIELD

## 2019-06-07 ENCOUNTER — Other Ambulatory Visit: Payer: BC Managed Care – PPO

## 2019-08-18 ENCOUNTER — Ambulatory Visit: Payer: Medicare Other | Attending: Internal Medicine

## 2019-08-18 DIAGNOSIS — Z23 Encounter for immunization: Secondary | ICD-10-CM

## 2019-08-18 NOTE — Progress Notes (Signed)
   Covid-19 Vaccination Clinic  Name:  [name]    MRN: [id number] DOB: Feb 05, 1956  08/18/2019  [name] was observed post Covid-19 immunization for 15 minutes without incidence. She was provided with Vaccine Information Sheet and instruction to access the V-Safe system.   [name] was instructed to call 911 with any severe reactions post vaccine: Marland Kitchen Difficulty breathing  . Swelling of your face and throat  . A fast heartbeat  . A bad rash all over your body  . Dizziness and weakness    Immunizations Administered    Name Date Dose VIS Date Route   Pfizer COVID-19 Vaccine 08/18/2019  8:47 AM 0.3 mL 07/08/2019 Intramuscular   Manufacturer: [employer], [employer]   Lot: [id number]   NDC: [address]-9

## 2019-08-22 ENCOUNTER — Other Ambulatory Visit: Payer: Self-pay | Admitting: Obstetrics and Gynecology

## 2019-08-22 DIAGNOSIS — Z1231 Encounter for screening mammogram for malignant neoplasm of breast: Secondary | ICD-10-CM

## 2019-09-08 ENCOUNTER — Ambulatory Visit: Payer: Medicare Other | Attending: Internal Medicine

## 2019-09-08 DIAGNOSIS — Z23 Encounter for immunization: Secondary | ICD-10-CM

## 2019-09-08 NOTE — Progress Notes (Signed)
   Covid-19 Vaccination Clinic  Name:  LIYAH HIGHAM    MRN: [id number] DOB: 01-Nov-1955  09/08/2019  Ms. Garguilo was observed post Covid-19 immunization for 15 minutes without incidence. She was provided with Vaccine Information Sheet and instruction to access the V-Safe system.   Ms. Duby was instructed to call 911 with any severe reactions post vaccine: Marland Kitchen Difficulty breathing  . Swelling of your face and throat  . A fast heartbeat  . A bad rash all over your body  . Dizziness and weakness    Immunizations Administered    Name Date Dose VIS Date Route   Pfizer COVID-19 Vaccine 09/08/2019  8:46 AM 0.3 mL 07/08/2019 Intramuscular   Manufacturer: [employer], [employer]   Lot: [id number]   NDC: [address]-8

## 2019-09-16 ENCOUNTER — Ambulatory Visit: Payer: Medicare Other

## 2019-11-07 ENCOUNTER — Ambulatory Visit
Admission: RE | Admit: 2019-11-07 | Discharge: 2019-11-07 | Disposition: A | Discharge status: Home / Self Care | Payer: Medicare Other | Source: Ambulatory Visit | Attending: Obstetrics and Gynecology | Admitting: Obstetrics and Gynecology

## 2019-11-07 ENCOUNTER — Other Ambulatory Visit: Payer: Self-pay

## 2019-11-07 DIAGNOSIS — Z1231 Encounter for screening mammogram for malignant neoplasm of breast: Secondary | ICD-10-CM

## 2019-11-07 DIAGNOSIS — E2839 Other primary ovarian failure: Secondary | ICD-10-CM

## 2020-02-04 ENCOUNTER — Other Ambulatory Visit: Payer: Self-pay

## 2020-02-04 ENCOUNTER — Encounter (HOSPITAL_BASED_OUTPATIENT_CLINIC_OR_DEPARTMENT_OTHER): Payer: Self-pay | Admitting: Emergency Medicine

## 2020-02-04 ENCOUNTER — Emergency Department (HOSPITAL_BASED_OUTPATIENT_CLINIC_OR_DEPARTMENT_OTHER)
Admission: EM | Admit: 2020-02-04 | Discharge: 2020-02-04 | Disposition: A | Discharge status: Home / Self Care | Payer: Medicare Other | Attending: Emergency Medicine | Admitting: Emergency Medicine

## 2020-02-04 DIAGNOSIS — Y999 Unspecified external cause status: Secondary | ICD-10-CM | POA: Diagnosis not present

## 2020-02-04 DIAGNOSIS — Z23 Encounter for immunization: Secondary | ICD-10-CM | POA: Insufficient documentation

## 2020-02-04 DIAGNOSIS — S61215A Laceration without foreign body of left ring finger without damage to nail, initial encounter: Secondary | ICD-10-CM | POA: Insufficient documentation

## 2020-02-04 DIAGNOSIS — W28XXXA Contact with powered lawn mower, initial encounter: Secondary | ICD-10-CM | POA: Insufficient documentation

## 2020-02-04 DIAGNOSIS — Y9389 Activity, other specified: Secondary | ICD-10-CM | POA: Diagnosis not present

## 2020-02-04 DIAGNOSIS — Y92009 Unspecified place in unspecified non-institutional (private) residence as the place of occurrence of the external cause: Secondary | ICD-10-CM | POA: Insufficient documentation

## 2020-02-04 DIAGNOSIS — S61213A Laceration without foreign body of left middle finger without damage to nail, initial encounter: Secondary | ICD-10-CM | POA: Diagnosis not present

## 2020-02-04 DIAGNOSIS — S6992XA Unspecified injury of left wrist, hand and finger(s), initial encounter: Secondary | ICD-10-CM

## 2020-02-04 MED ORDER — CEPHALEXIN 250 MG PO CAPS
500.0000 mg | ORAL_CAPSULE | Freq: Once | ORAL | Status: AC
  Administered 2020-02-04: 500 mg via ORAL
  Filled 2020-02-04: qty 2

## 2020-02-04 MED ORDER — CEPHALEXIN 500 MG PO CAPS: 500.0000 mg | ORAL_CAPSULE | Freq: Four times a day (QID) | ORAL | 0 refills | Status: AC

## 2020-02-04 MED ORDER — LIDOCAINE HCL 2 % IJ SOLN
10.0000 mL | Freq: Once | INTRAMUSCULAR | Status: AC
  Administered 2020-02-04: 200 mg via INTRADERMAL
  Filled 2020-02-04: qty 20

## 2020-02-04 MED ORDER — TETANUS-DIPHTH-ACELL PERTUSSIS 5-2.5-18.5 LF-MCG/0.5 IM SUSP
0.5000 mL | Freq: Once | INTRAMUSCULAR | Status: AC
  Administered 2020-02-04: 0.5 mL via INTRAMUSCULAR
  Filled 2020-02-04: qty 0.5

## 2020-02-04 NOTE — ED Provider Notes (Signed)
[facility] EMERGENCY DEPARTMENT Provider Note   CSN: [id number] Arrival date & time: 02/04/20  2032     History Chief Complaint  Patient presents with  . Hand Injury    Brittney Thompson is a 64 y.o. female with history of rheumatoid arthritis presenting for evaluation of acute onset, persistent laceration to the dorsum of the left third and fourth digits.  Reports that at around 7:30 PM she was attempting to clean her lawnmower (which she states was not on) when her hand slipped and she cut herself against the lawnmower blade.  She is not on any blood thinners.  She denies numbness or tingling to the digits.  She notes some soreness around the laceration sites.  She cleaned the wounds with peroxide.  She has noted some swelling around the involved digits.  She is not sure if her tetanus is up-to-date.  She is right-hand dominant.  The history is provided by the patient.       History reviewed. No pertinent past medical history.  There are no problems to display for this patient.   History reviewed. No pertinent surgical history.   OB History   No obstetric history on file.     History reviewed. No pertinent family history.  Social History   Tobacco Use  . Smoking status: Not on file  Substance Use Topics  . Alcohol use: Not on file  . Drug use: Not on file    Home Medications Prior to Admission medications   Not on File    Allergies    Patient has no known allergies.  Review of Systems   Review of Systems  Constitutional: Negative for chills and fever.  Skin: Positive for wound.  Neurological: Negative for weakness and numbness.  All other systems reviewed and are negative.   Physical Exam Updated Vital Signs BP (!) 145/78 (BP Location: Right Arm)   Pulse 92   Temp 97.9 F (36.6 C) (Oral)   Resp 20   Wt 59.9 kg   SpO2 98%   Physical Exam Vitals and nursing note reviewed.  Constitutional:      General: She is not in acute  distress.    Appearance: She is well-developed.  HENT:     Head: Normocephalic and atraumatic.  Eyes:     General:        Right eye: No discharge.        Left eye: No discharge.     Conjunctiva/sclera: Conjunctivae normal.  Neck:     Vascular: No JVD.     Trachea: No tracheal deviation.  Cardiovascular:     Rate and Rhythm: Normal rate.     Pulses: Normal pulses.     Comments: 2+ radial pulses bilaterally Pulmonary:     Effort: Pulmonary effort is normal.  Abdominal:     General: There is no distension.  Musculoskeletal:        General: Signs of injury present.     Cervical back: Normal range of motion.     Comments: See below image.  Patient with 3 cm V-shaped laceration to the dorsum of the left fourth digit overlying the middle phalanx.  She has a 1.5 cm laceration distal to the left third PIP joint.  Bleeding is controlled.  She has additional smaller more superficial lacerations.  No visible tendon.  She is able to flex and extend the digits against resistance without difficulty.  Bleeding is controlled.  Skin:    General: Skin is warm  and dry.     Capillary Refill: Capillary refill takes less than 2 seconds.     Findings: No erythema.  Neurological:     Mental Status: She is alert.     Comments: Sensation intact to light touch of bilateral hands.  Good grip strength bilaterally.  Psychiatric:        Behavior: Behavior normal.         ED Results / Procedures / Treatments   Labs (all labs ordered are listed, but only abnormal results are displayed) Labs Reviewed - No data to display  EKG None  Radiology No results found.  Procedures .Marland KitchenLaceration Repair  Date/Time: 02/04/2020 11:34 PM Performed by: Jeanie Sewer, PA-C Authorized by: Jeanie Sewer, PA-C   Consent:    Consent obtained:  Verbal   Consent given by:  Patient   Risks discussed:  Infection, need for additional repair, pain, poor cosmetic result and poor wound healing   Alternatives discussed:   No treatment and delayed treatment Universal protocol:    Procedure explained and questions answered to patient or proxy's satisfaction: yes     Relevant documents present and verified: yes     Test results available and properly labeled: yes     Imaging studies available: yes     Required blood products, implants, devices, and special equipment available: yes     Site/side marked: yes     Immediately prior to procedure, a time out was called: yes     Patient identity confirmed:  Verbally with patient Anesthesia (see MAR for exact dosages):    Anesthesia method:  Local infiltration   Local anesthetic:  Lidocaine 2% w/o epi Laceration details:    Location:  Finger   Finger location:  L long finger   Length (cm):  1.5   Depth (mm):  2 Repair type:    Repair type:  Intermediate Pre-procedure details:    Preparation:  Patient was prepped and draped in usual sterile fashion Exploration:    Hemostasis achieved with:  Direct pressure   Wound exploration: wound explored through full range of motion     Wound extent: areolar tissue violated     Wound extent: no nerve damage noted and no tendon damage noted     Contaminated: yes   Treatment:    Area cleansed with:  Betadine and saline   Amount of cleaning:  Extensive   Irrigation solution:  Sterile saline   Irrigation method:  Pressure wash   Visualized foreign bodies/material removed: no   Skin repair:    Repair method:  Sutures   Suture size:  4-0   Suture material:  Prolene   Suture technique:  Simple interrupted   Number of sutures:  3 Approximation:    Approximation:  Close Post-procedure details:    Dressing:  Splint for protection   Patient tolerance of procedure:  Tolerated well, no immediate complications .Marland KitchenLaceration Repair  Date/Time: 02/04/2020 11:35 PM Performed by: Jeanie Sewer, PA-C Authorized by: Jeanie Sewer, PA-C   Consent:    Consent obtained:  Verbal   Consent given by:  Patient   Risks discussed:   Infection, need for additional repair, pain, poor cosmetic result and poor wound healing   Alternatives discussed:  No treatment and delayed treatment Universal protocol:    Procedure explained and questions answered to patient or proxy's satisfaction: yes     Relevant documents present and verified: yes     Test results available and properly labeled: yes  Imaging studies available: yes     Required blood products, implants, devices, and special equipment available: yes     Site/side marked: yes     Immediately prior to procedure, a time out was called: yes     Patient identity confirmed:  Verbally with patient Anesthesia (see MAR for exact dosages):    Anesthesia method:  Local infiltration   Local anesthetic:  Lidocaine 2% WITH epi Laceration details:    Location:  Finger   Finger location:  L ring finger   Length (cm):  3   Depth (mm):  3 Repair type:    Repair type:  Intermediate Exploration:    Hemostasis achieved with:  Direct pressure   Wound exploration: wound explored through full range of motion     Wound extent: no tendon damage noted     Contaminated: yes   Treatment:    Area cleansed with:  Betadine and saline   Amount of cleaning:  Extensive   Irrigation solution:  Sterile saline   Irrigation method:  Pressure wash Skin repair:    Repair method:  Sutures   Suture size:  4-0   Suture material:  Prolene   Suture technique:  Simple interrupted   Number of sutures:  5 Approximation:    Approximation:  Close Post-procedure details:    Dressing:  Splint for protection   Patient tolerance of procedure:  Tolerated well, no immediate complications   (including critical care time)  Medications Ordered in ED Medications  cephALEXin (KEFLEX) capsule 500 mg (has no administration in time range)  lidocaine (XYLOCAINE) 2 % (with pres) injection 200 mg (200 mg Intradermal Given by Other 02/04/20 2146)  Tdap (BOOSTRIX) injection 0.5 mL (0.5 mLs Intramuscular Given  02/04/20 2143)    ED Course  I have reviewed the triage vital signs and the nursing notes.  Pertinent labs & imaging results that were available during my care of the patient were reviewed by me and considered in my medical decision making (see chart for details).    MDM Rules/Calculators/A&P                          Patient presenting for evaluation of a laceration to the dorsum of the left hand involving the third and fourth digits.  She is afebrile, vital signs are stable.  She is nontoxic in appearance.  She is neurovascularly intact.  She is able to flex and extend digits against resistance without difficulty.  She does not have any significant tenderness to palpation and with intact range of motion I have a low suspicion of underlying fracture.  We had a shared decision-making conversation and she elects to forego imaging at this time which I think is reasonable.  Doubt retained foreign body.  The wound was extensively irrigated and the base of the wound was visualized in a bloodless field.  I was able to visualize the extensor tendon involving the fourth digit which appears intact.  She tolerated laceration repair without difficulty.  Fingers were buddy taped for protection.  She will follow-up in 7 days for suture removal.  We will start her on prophylactic antibiotics.  Discussed indications for return to the ED sooner.  Patient and her friend who is at the bedside verbalized understanding of and agreement with plan and patient is stable for discharge at this time. Final Clinical Impression(s) / ED Diagnoses Final diagnoses:  Injury of left hand, initial encounter  Laceration of left middle  finger without foreign body, nail damage status unspecified, initial encounter  Laceration of left ring finger without foreign body, nail damage status unspecified, initial encounter    Rx / DC Orders ED Discharge Orders    None       Bennye Alm 02/04/20 2343    Maia Plan,  MD 02/11/20 2048

## 2020-02-04 NOTE — ED Triage Notes (Signed)
Patient states that she cut herself on the lawn equipment at home. The patient has 2 noted lacerations to the left hand

## 2020-02-04 NOTE — Discharge Instructions (Signed)
1. Medications: Please take all of your antibiotics until finished!   Take your antibiotics with food.  Common side effects of antibiotics include nausea, vomiting, abdominal discomfort, and diarrhea. You may help offset some of this with probiotics which you can buy or get in yogurt. Do not eat  or take the probiotics until 2 hours after your antibiotic.    Alternate 600 mg of ibuprofen and [phone] mg of Tylenol every 3 hours as needed for pain. Do not exceed 4000 mg of Tylenol daily.  Take ibuprofen with food to avoid upset stomach issues. 2. Treatment: ice for swelling, keep wound clean with warm soap and water and keep bandage dry, do not submerge in water for 24 hours 3. Follow Up: Please return in 7 days to have your stitches/staples removed or sooner if you have concerns.  You may also go to urgent care or your primary care physician.  Return to the emergency department sooner if any concerning signs or symptoms develop such as high fevers, redness, drainage of pus from the wound, or swelling.   WOUND CARE  Keep area clean and dry for 24 hours. Do not remove bandage, if applied.  After 24 hours, remove bandage and wash wound gently with mild soap and warm water. Reapply a new bandage after cleaning wound, if directed.   Continue daily cleansing with soap and water until stitches/staples are removed.  Do not apply any ointments or creams to the wound while stitches/staples are in place, as this may cause delayed healing. Return if you experience any of the following signs of infection: Swelling, redness, pus drainage, streaking, fever >101.0 F  Return if you experience excessive bleeding that does not stop after 15-20 minutes of constant, firm pressure.

## 2020-08-31 ENCOUNTER — Other Ambulatory Visit: Payer: Self-pay | Admitting: Family Medicine

## 2020-08-31 ENCOUNTER — Other Ambulatory Visit (HOSPITAL_BASED_OUTPATIENT_CLINIC_OR_DEPARTMENT_OTHER): Payer: Self-pay | Admitting: Family Medicine

## 2020-08-31 DIAGNOSIS — R1030 Lower abdominal pain, unspecified: Secondary | ICD-10-CM

## 2020-09-05 ENCOUNTER — Encounter (HOSPITAL_BASED_OUTPATIENT_CLINIC_OR_DEPARTMENT_OTHER): Payer: Self-pay

## 2020-09-05 ENCOUNTER — Ambulatory Visit (HOSPITAL_BASED_OUTPATIENT_CLINIC_OR_DEPARTMENT_OTHER)
Admission: RE | Admit: 2020-09-05 | Discharge: 2020-09-05 | Disposition: A | Discharge status: Home / Self Care | Payer: Medicare Other | Source: Ambulatory Visit | Attending: Family Medicine | Admitting: Family Medicine

## 2020-09-05 ENCOUNTER — Other Ambulatory Visit: Payer: Self-pay

## 2020-09-05 DIAGNOSIS — R1030 Lower abdominal pain, unspecified: Secondary | ICD-10-CM | POA: Diagnosis not present

## 2020-09-05 HISTORY — DX: Essential (primary) hypertension: I10

## 2020-09-05 MED ORDER — IOHEXOL 300 MG/ML  SOLN
100.0000 mL | Freq: Once | INTRAMUSCULAR | Status: AC | PRN
  Administered 2020-09-05: 100 mL via INTRAVENOUS

## 2020-09-18 ENCOUNTER — Other Ambulatory Visit: Payer: Medicare Other

## 2020-09-20 ENCOUNTER — Encounter: Payer: Self-pay | Admitting: Vascular Surgery

## 2020-09-20 ENCOUNTER — Other Ambulatory Visit: Payer: Self-pay

## 2020-09-20 ENCOUNTER — Ambulatory Visit (INDEPENDENT_AMBULATORY_CARE_PROVIDER_SITE_OTHER): Payer: Medicare Other | Admitting: Vascular Surgery

## 2020-09-20 VITALS — BP 143/86 | HR 73 | Temp 97.9°F | Resp 20 | Ht 59.0 in | Wt 127.9 lb

## 2020-09-20 DIAGNOSIS — I708 Atherosclerosis of other arteries: Secondary | ICD-10-CM

## 2020-09-20 NOTE — Progress Notes (Signed)
Referring Physician: Tera Helper PA  Patient name: Brittney Thompson MRN: [id number] DOB: 1955/10/29 Sex: female  REASON FOR CONSULT: Abdominal pain  HPI: Brittney Thompson is a 65 y.o. 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  The patient has had abdominal pain for a couple of months.  It has not really improved.  The pain occurs in the suprapubic region.  She states that if she is busy doing other things as distracted she usually does not notice it.  It is fairly continuous in nature and not colicky.  She has no associated nausea and vomiting.  She has not had any weight loss.  She does not have food fear.  It does not seem to be related to meals.  She has no prior history of myocardial infarction or stroke.  She does not use tobacco.  Her only other medical problems include hypertension and rheumatoid arthritis.  She apparently has appointment scheduled in the near future with urology and gastroenterology for follow-up regarding this pain as well.  Past Medical History:  Diagnosis Date  . Hypertension   Rheumatoid arthritis History reviewed. No pertinent surgical history.  History reviewed. No pertinent family history.  SOCIAL HISTORY: Social History   Socioeconomic History  . Marital status: Married    Spouse name: Not on file  . Number of children: Not on file  . Years of education: Not on file  . Highest education level: Not on file  Occupational History  . Not on file  Tobacco Use  . Smoking status: Never Smoker  . Smokeless tobacco: Never Used  Vaping Use  . Vaping Use: Never used  Substance and Sexual Activity  . Alcohol use: Not Currently  . Drug use: Never  . Sexual activity: Not on file  Other Topics Concern  . Not on file  Social History Narrative  . Not on file   Social Determinants of Health   Financial Resource Strain: Not on file  Food Insecurity: Not on file  Transportation Needs: Not  on file  Physical Activity: Not on file  Stress: Not on file  Social Connections: Not on file  Intimate Partner Violence: Not on file    Allergies  Allergen Reactions  . Hydrochlorothiazide     Other reaction(s): hypokalemia    Current Outpatient Medications  Medication Sig Dispense Refill  . atenolol (TENORMIN) 50 MG tablet Take 0.5 tablets by mouth daily.    . B Complex Vitamins (B COMPLEX 1 PO) 1 tablet    . cholecalciferol (VITAMIN D3) 25 MCG (1000 UNIT) tablet 1 tablet    . folic acid (FOLVITE) 1 MG tablet 1 tablet    . Krill Oil 500 MG CAPS 1 capsule    . Methotrexate, Anti-Rheumatic, (METHOTREXATE, PF, [place]) 2.5 mg.    . Multiple Vitamin (MULTIVITAMINS PO) 1     No current facility-administered medications for this visit.    ROS:   General:  No weight loss, Fever, chills  HEENT: No recent headaches, no nasal bleeding, no visual changes, no sore throat  Neurologic: No dizziness, blackouts, seizures. No recent symptoms of stroke or mini- stroke. No recent episodes of slurred speech, or temporary blindness.  Cardiac: No recent episodes of chest pain/pressure, no shortness of breath at rest.  No shortness of breath with exertion.  Denies history of atrial fibrillation or irregular heartbeat  Vascular: No history of rest pain in feet.  No history of claudication.  No history of non-healing ulcer, No history of DVT   Pulmonary: No home oxygen, no productive cough, no hemoptysis,  No asthma or wheezing  Musculoskeletal:  [X]  Arthritis, [ ]  Low back pain,  [X]  Joint pain  Hematologic:No history of hypercoagulable state.  No history of easy bleeding.  No history of anemia  Gastrointestinal: No hematochezia or melena,  No gastroesophageal reflux, no trouble swallowing  Urinary: [ ]  chronic Kidney disease, [ ]  on HD - [ ]  MWF or [ ]  TTHS, [ ]  Burning with urination, [ ]  Frequent urination, [ ]  Difficulty urinating;   Skin: No rashes  Psychological: No history of anxiety,   No history of depression   Physical Examination  Vitals:   09/20/20 0917  BP: (!) 143/86  Pulse: 73  Resp: 20  Temp: 97.9 F (36.6 C)  SpO2: 99%  Weight: 127 lb 14.4 oz (58 kg)  Height: 4\' 11"  (1.499 m)    Body mass index is 25.83 kg/m.  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  The superior mesenteric artery is widely patent without stenosis.  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  There is also mild left hydronephrosis.  ASSESSMENT: Chronic abdominal pain.  I doubt that this is related to her celiac artery occlusion.  She has a widely patent superior mesenteric artery and the celiac branches all fill completely.  Her symptoms are primarily suprapubic in nature which again is not usually consistent with mesenteric ischemia.   PLAN: The patient will keep her scheduled appointments with gastroenterology and urology.  If the pain is unresolved and GI and urologic work-up is completely negative would revisit whether or not to consider the celiac artery as a possible etiology for her pain.  However, suprapubic pain would be a rare symptom of this.  , MD [facility] Office: [phone]    , MD [facility] Office: [phone]

## 2020-10-04 ENCOUNTER — Other Ambulatory Visit: Payer: Self-pay | Admitting: Obstetrics and Gynecology

## 2020-10-04 DIAGNOSIS — Z1231 Encounter for screening mammogram for malignant neoplasm of breast: Secondary | ICD-10-CM

## 2020-11-27 ENCOUNTER — Other Ambulatory Visit: Payer: Self-pay

## 2020-11-27 ENCOUNTER — Ambulatory Visit
Admission: RE | Admit: 2020-11-27 | Discharge: 2020-11-27 | Disposition: A | Discharge status: Home / Self Care | Payer: Medicare Other | Source: Ambulatory Visit | Attending: Obstetrics and Gynecology | Admitting: Obstetrics and Gynecology

## 2020-11-27 DIAGNOSIS — Z1231 Encounter for screening mammogram for malignant neoplasm of breast: Secondary | ICD-10-CM

## 2021-10-17 ENCOUNTER — Other Ambulatory Visit: Payer: Self-pay | Admitting: Obstetrics and Gynecology

## 2021-10-17 DIAGNOSIS — Z1231 Encounter for screening mammogram for malignant neoplasm of breast: Secondary | ICD-10-CM

## 2021-11-28 ENCOUNTER — Ambulatory Visit: Payer: Medicare Other

## 2021-11-28 ENCOUNTER — Ambulatory Visit
Admission: RE | Admit: 2021-11-28 | Discharge: 2021-11-28 | Disposition: A | Discharge status: Home / Self Care | Payer: Medicare Other | Source: Ambulatory Visit | Attending: Obstetrics and Gynecology | Admitting: Obstetrics and Gynecology

## 2021-11-28 DIAGNOSIS — Z1231 Encounter for screening mammogram for malignant neoplasm of breast: Secondary | ICD-10-CM

## 2022-03-27 ENCOUNTER — Other Ambulatory Visit: Payer: Self-pay | Admitting: Pediatrics

## 2022-03-27 ENCOUNTER — Other Ambulatory Visit: Payer: Self-pay | Admitting: Obstetrics and Gynecology

## 2022-03-27 DIAGNOSIS — M8580 Other specified disorders of bone density and structure, unspecified site: Secondary | ICD-10-CM

## 2022-09-05 ENCOUNTER — Ambulatory Visit
Admission: RE | Admit: 2022-09-05 | Discharge: 2022-09-05 | Disposition: A | Discharge status: Home / Self Care | Payer: Medicare Other | Source: Ambulatory Visit | Attending: Pediatrics | Admitting: Pediatrics

## 2022-09-05 DIAGNOSIS — M8580 Other specified disorders of bone density and structure, unspecified site: Secondary | ICD-10-CM

## 2022-10-27 ENCOUNTER — Other Ambulatory Visit: Payer: Self-pay | Admitting: Obstetrics and Gynecology

## 2022-10-27 DIAGNOSIS — Z1231 Encounter for screening mammogram for malignant neoplasm of breast: Secondary | ICD-10-CM

## 2022-12-09 ENCOUNTER — Ambulatory Visit
Admission: RE | Admit: 2022-12-09 | Discharge: 2022-12-09 | Disposition: A | Discharge status: Home / Self Care | Payer: Medicare Other | Source: Ambulatory Visit | Attending: Obstetrics and Gynecology | Admitting: Obstetrics and Gynecology

## 2022-12-09 DIAGNOSIS — Z1231 Encounter for screening mammogram for malignant neoplasm of breast: Secondary | ICD-10-CM

## 2023-10-28 ENCOUNTER — Other Ambulatory Visit: Payer: Self-pay | Admitting: Obstetrics and Gynecology

## 2023-10-28 DIAGNOSIS — Z1231 Encounter for screening mammogram for malignant neoplasm of breast: Secondary | ICD-10-CM

## 2023-12-03 ENCOUNTER — Encounter (HOSPITAL_COMMUNITY): Payer: Self-pay

## 2023-12-10 ENCOUNTER — Ambulatory Visit
Admission: RE | Admit: 2023-12-10 | Discharge: 2023-12-10 | Disposition: A | Discharge status: Home / Self Care | Source: Ambulatory Visit | Attending: Obstetrics and Gynecology | Admitting: Obstetrics and Gynecology

## 2023-12-10 DIAGNOSIS — Z1231 Encounter for screening mammogram for malignant neoplasm of breast: Secondary | ICD-10-CM
